# Patient Record
Sex: Male | Born: 1975 | Race: Black or African American | Hispanic: No | Marital: Married | Smoking: Current every day smoker
Health system: Southern US, Community
[De-identification: ages and names within clinical notes are randomized; demographics above are authoritative.]

---

## 1995-10-01 HISTORY — PX: MANDIBLE SURGERY: SHX707

## 2014-06-23 ENCOUNTER — Emergency Department: Payer: Self-pay | Admitting: Emergency Medicine

## 2017-07-10 ENCOUNTER — Encounter: Payer: Self-pay | Admitting: Emergency Medicine

## 2017-07-10 ENCOUNTER — Emergency Department: Payer: No Typology Code available for payment source

## 2017-07-10 ENCOUNTER — Emergency Department
Admission: EM | Admit: 2017-07-10 | Discharge: 2017-07-10 | Disposition: A | Discharge status: Home / Self Care | Payer: No Typology Code available for payment source | Attending: Emergency Medicine | Admitting: Emergency Medicine

## 2017-07-10 DIAGNOSIS — Y939 Activity, unspecified: Secondary | ICD-10-CM | POA: Insufficient documentation

## 2017-07-10 DIAGNOSIS — S301XXA Contusion of abdominal wall, initial encounter: Secondary | ICD-10-CM | POA: Diagnosis not present

## 2017-07-10 DIAGNOSIS — Y999 Unspecified external cause status: Secondary | ICD-10-CM | POA: Diagnosis not present

## 2017-07-10 DIAGNOSIS — M7918 Myalgia, other site: Secondary | ICD-10-CM | POA: Diagnosis present

## 2017-07-10 DIAGNOSIS — Y92410 Unspecified street and highway as the place of occurrence of the external cause: Secondary | ICD-10-CM | POA: Insufficient documentation

## 2017-07-10 DIAGNOSIS — F17200 Nicotine dependence, unspecified, uncomplicated: Secondary | ICD-10-CM | POA: Insufficient documentation

## 2017-07-10 MED ORDER — IBUPROFEN 800 MG PO TABS: 800.0000 mg | ORAL_TABLET | Freq: Three times a day (TID) | ORAL | 0 refills | Status: DC | PRN

## 2017-07-10 MED ORDER — CYCLOBENZAPRINE HCL 5 MG PO TABS: 5.0000 mg | ORAL_TABLET | Freq: Three times a day (TID) | ORAL | 0 refills | Status: DC | PRN

## 2017-07-10 MED ORDER — CYCLOBENZAPRINE HCL 10 MG PO TABS
10.0000 mg | ORAL_TABLET | Freq: Once | ORAL | Status: AC
  Administered 2017-07-10: 10 mg via ORAL
  Filled 2017-07-10: qty 1

## 2017-07-10 MED ORDER — NAPROXEN 500 MG PO TABS
500.0000 mg | ORAL_TABLET | Freq: Once | ORAL | Status: AC
  Administered 2017-07-10: 500 mg via ORAL
  Filled 2017-07-10: qty 1
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
## 2018-03-11 ENCOUNTER — Ambulatory Visit: Payer: Self-pay | Admitting: Family Medicine

## 2018-06-22 ENCOUNTER — Ambulatory Visit (INDEPENDENT_AMBULATORY_CARE_PROVIDER_SITE_OTHER): Payer: Self-pay | Admitting: Family Medicine

## 2018-06-22 ENCOUNTER — Encounter: Payer: Self-pay | Admitting: Family Medicine

## 2018-06-22 VITALS — BP 122/78 | HR 82 | Temp 98.2°F | Resp 16 | Ht 72.25 in | Wt 191.1 lb

## 2018-06-22 DIAGNOSIS — Z131 Encounter for screening for diabetes mellitus: Secondary | ICD-10-CM

## 2018-06-22 DIAGNOSIS — K769 Liver disease, unspecified: Secondary | ICD-10-CM

## 2018-06-22 DIAGNOSIS — Z113 Encounter for screening for infections with a predominantly sexual mode of transmission: Secondary | ICD-10-CM

## 2018-06-22 DIAGNOSIS — Z23 Encounter for immunization: Secondary | ICD-10-CM

## 2018-06-22 DIAGNOSIS — Z13220 Encounter for screening for lipoid disorders: Secondary | ICD-10-CM

## 2018-06-22 DIAGNOSIS — Z8719 Personal history of other diseases of the digestive system: Secondary | ICD-10-CM | POA: Insufficient documentation
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
## 2018-06-23 ENCOUNTER — Other Ambulatory Visit: Payer: Self-pay | Admitting: Family Medicine

## 2018-07-02 LAB — GC/CHLAMYDIA PROBE AMP
CHLAMYDIA, DNA PROBE: NEGATIVE
NEISSERIA GONORRHOEAE BY PCR: NEGATIVE

## 2018-08-03 ENCOUNTER — Ambulatory Visit: Payer: Self-pay | Admitting: Gastroenterology

## 2018-10-12 ENCOUNTER — Encounter: Payer: Self-pay | Admitting: Family Medicine
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
## 2019-08-18 ENCOUNTER — Other Ambulatory Visit: Payer: Self-pay | Admitting: Family Medicine

## 2019-08-18 DIAGNOSIS — K769 Liver disease, unspecified: Secondary | ICD-10-CM

## 2023-06-21 DIAGNOSIS — M50223 Other cervical disc displacement at C6-C7 level: Secondary | ICD-10-CM | POA: Diagnosis not present

## 2023-06-21 DIAGNOSIS — Z79891 Long term (current) use of opiate analgesic: Secondary | ICD-10-CM | POA: Diagnosis not present

## 2023-06-21 DIAGNOSIS — S60511A Abrasion of right hand, initial encounter: Secondary | ICD-10-CM | POA: Diagnosis not present

## 2023-06-21 DIAGNOSIS — S63112D Subluxation of metacarpophalangeal joint of left thumb, subsequent encounter: Secondary | ICD-10-CM | POA: Diagnosis not present

## 2023-06-21 DIAGNOSIS — F17290 Nicotine dependence, other tobacco product, uncomplicated: Secondary | ICD-10-CM | POA: Diagnosis not present

## 2023-06-21 DIAGNOSIS — S80212A Abrasion, left knee, initial encounter: Secondary | ICD-10-CM | POA: Diagnosis not present

## 2023-06-21 DIAGNOSIS — S0083XA Contusion of other part of head, initial encounter: Secondary | ICD-10-CM | POA: Diagnosis not present

## 2023-06-21 DIAGNOSIS — S60512A Abrasion of left hand, initial encounter: Secondary | ICD-10-CM | POA: Diagnosis not present

## 2023-06-21 DIAGNOSIS — M47812 Spondylosis without myelopathy or radiculopathy, cervical region: Secondary | ICD-10-CM | POA: Diagnosis not present

## 2023-06-21 DIAGNOSIS — S63115A Dislocation of metacarpophalangeal joint of left thumb, initial encounter: Secondary | ICD-10-CM | POA: Diagnosis not present

## 2023-06-21 DIAGNOSIS — S80211A Abrasion, right knee, initial encounter: Secondary | ICD-10-CM | POA: Diagnosis not present
